# Patient Record
Sex: Male | Born: 1995 | Hispanic: Yes | Marital: Single | Smoking: Never smoker
Health system: Southern US, Community
[De-identification: ages and names within clinical notes are randomized; demographics above are authoritative.]

---

## 2018-06-20 ENCOUNTER — Other Ambulatory Visit: Payer: Self-pay

## 2018-06-20 ENCOUNTER — Emergency Department
Admission: EM | Admit: 2018-06-20 | Discharge: 2018-06-20 | Disposition: A | Discharge status: Home / Self Care | Payer: Self-pay | Attending: Emergency Medicine | Admitting: Emergency Medicine

## 2018-06-20 ENCOUNTER — Emergency Department: Payer: Worker's Compensation

## 2018-06-20 DIAGNOSIS — Y9389 Activity, other specified: Secondary | ICD-10-CM | POA: Insufficient documentation

## 2018-06-20 DIAGNOSIS — S46911A Strain of unspecified muscle, fascia and tendon at shoulder and upper arm level, right arm, initial encounter: Secondary | ICD-10-CM | POA: Insufficient documentation

## 2018-06-20 DIAGNOSIS — Y9289 Other specified places as the place of occurrence of the external cause: Secondary | ICD-10-CM | POA: Insufficient documentation

## 2018-06-20 DIAGNOSIS — Y998 Other external cause status: Secondary | ICD-10-CM | POA: Insufficient documentation

## 2018-06-20 DIAGNOSIS — W228XXA Striking against or struck by other objects, initial encounter: Secondary | ICD-10-CM | POA: Insufficient documentation

## 2018-06-20 MED ORDER — MELOXICAM 15 MG PO TABS: 15.0000 mg | ORAL_TABLET | Freq: Every day | ORAL | 2 refills | Status: AC

## 2018-06-20 NOTE — ED Notes (Signed)
See triage note  States he lifted something heavy 2 days ago  Developed pain to right shoulder   No deformity  Having increased pain with movement  Good pulses

## 2018-06-20 NOTE — ED Triage Notes (Signed)
Pt states he injured his right shoulder while at work on Wednesday while lifting a plastic roll.

## 2018-06-20 NOTE — ED Provider Notes (Signed)
[facility] Emergency Department Provider Note  ____________________________________________   First MD Initiated Contact with Patient 06/20/18 1137     (approximate)  I have reviewed the triage vital signs and the nursing notes.   HISTORY  Chief Complaint Shoulder Injury    HPI Spencer Wheeler is a 23 y.o. male presents emergency department complaining of right shoulder pain.  Patient states he was lifting a heavy roll of plastic and when it landed on his right shoulder he felt a pop.  He states he has popping and clicking when he elevates the shoulder and the pain is located towards the back of the scapula.  He denies any numbness or tingling.    History reviewed. No pertinent past medical history.  There are no active problems to display for this patient.   History reviewed. No pertinent surgical history.  Prior to Admission medications   Medication Sig Start Date End Date Taking? Authorizing Provider  meloxicam (MOBIC) 15 MG tablet Take 1 tablet (15 mg total) by mouth daily. 06/20/18 06/20/19  Faythe GheeFisher, Susan W, PA-C    Allergies Patient has no known allergies.  No family history on file.  Social History Social History   Tobacco Use  . Smoking status: Never Smoker  . Smokeless tobacco: Never Used  Substance Use Topics  . Alcohol use: Not Currently  . Drug use: Not Currently    Review of Systems  Constitutional: No fever/chills Eyes: No visual changes. ENT: No sore throat. Respiratory: Denies cough Genitourinary: Negative for dysuria. Musculoskeletal: Negative for back pain.  Positive for right shoulder pain Skin: Negative for rash.    ____________________________________________   PHYSICAL EXAM:  VITAL SIGNS: ED Triage Vitals  Enc Vitals Group     BP 06/20/18 1122 128/78     Pulse Rate 06/20/18 1123 88     Resp 06/20/18 1122 18     Temp 06/20/18 1122 98 F (36.7 C)     Temp Source 06/20/18 1122 Oral     SpO2 06/20/18  1122 99 %     Weight 06/20/18 1114 140 lb (63.5 kg)     Height 06/20/18 1114 5\' 8"  (1.727 m)     Head Circumference --      Peak Flow --      Pain Score 06/20/18 1114 8     Pain Loc --      Pain Edu? --      Excl. in GC? --     Constitutional: Alert and oriented. Well appearing and in no acute distress. Eyes: Conjunctivae are normal.  Head: Atraumatic. Nose: No congestion/rhinnorhea. Mouth/Throat: Mucous membranes are moist.   Neck:  supple no lymphadenopathy noted Cardiovascular: Normal rate, regular rhythm. Respiratory: Normal respiratory effort.  No retractions, GU: deferred Musculoskeletal: FROM all extremities, warm and well perfused, the right upper back is tender at the scapular area, scapula bursa is tender, neurovascular is intact Neurologic:  Normal speech and language.  Skin:  Skin is warm, dry and intact. No rash noted. Psychiatric: Mood and affect are normal. Speech and behavior are normal.  ____________________________________________   LABS (all labs ordered are listed, but only abnormal results are displayed)  Labs Reviewed - No data to display ____________________________________________   ____________________________________________  RADIOLOGY  X-ray of the right shoulder is negative for any acute abnormality  ____________________________________________   PROCEDURES  Procedure(s) performed: Sling applied by nursing staff   Procedures    ____________________________________________   INITIAL IMPRESSION / ASSESSMENT AND PLAN / ED  COURSE  Pertinent labs & imaging results that were available during my care of the patient were reviewed by me and considered in my medical decision making (see chart for details).   Patient is 23 year old male presents emergency department with workers comp injury of the right shoulder.  Physical exam shows the right shoulder to be tender along with some tenderness underneath the scapula.  X-ray of the right  shoulder is negative for any acute abnormality  Patient was given a sling.  Prescription for meloxicam.  He is to follow-up with orthopedics of Worker's Comp. choice and he was given the on-call physician's phone number also.  He states he understands and will comply.  Light duty instructions were given for 1 week.  And a UDS was obtained here in the ED.      As part of my medical decision making, I reviewed the following data within the electronic MEDICAL RECORD NUMBER Nursing notes reviewed and incorporated, Interpreter needed, Old chart reviewed, Radiograph reviewed x-ray of the right shoulder is negative, Notes from prior ED visits and [place] Controlled Substance Database  ____________________________________________   FINAL CLINICAL IMPRESSION(S) / ED DIAGNOSES  Final diagnoses:  Shoulder strain, right, initial encounter      NEW MEDICATIONS STARTED DURING THIS VISIT:  Discharge Medication List as of 06/20/2018 12:12 PM    START taking these medications   Details  meloxicam (MOBIC) 15 MG tablet Take 1 tablet (15 mg total) by mouth daily., Starting Fri 06/20/2018, Until Sat 06/20/2019, Print         Note:  This document was prepared using Dragon voice recognition software and may include unintentional dictation errors.    Faythe Ghee, PA-C 06/20/18 1533    Jene Every, MD 06/21/18 1535

## 2019-12-01 IMAGING — CR RIGHT SHOULDER - 2+ VIEW
1 series · 3 of 3 positions shown · non-contrast
Comparison: None.

CLINICAL DATA: Right shoulder pain after lifting a heavy object 2
days ago.

EXAM:
RIGHT SHOULDER - 2+ VIEW

[Series 1: dg shoulder right · 0.14mm/px · 3 of 3 slices shown]
[im 1/3]
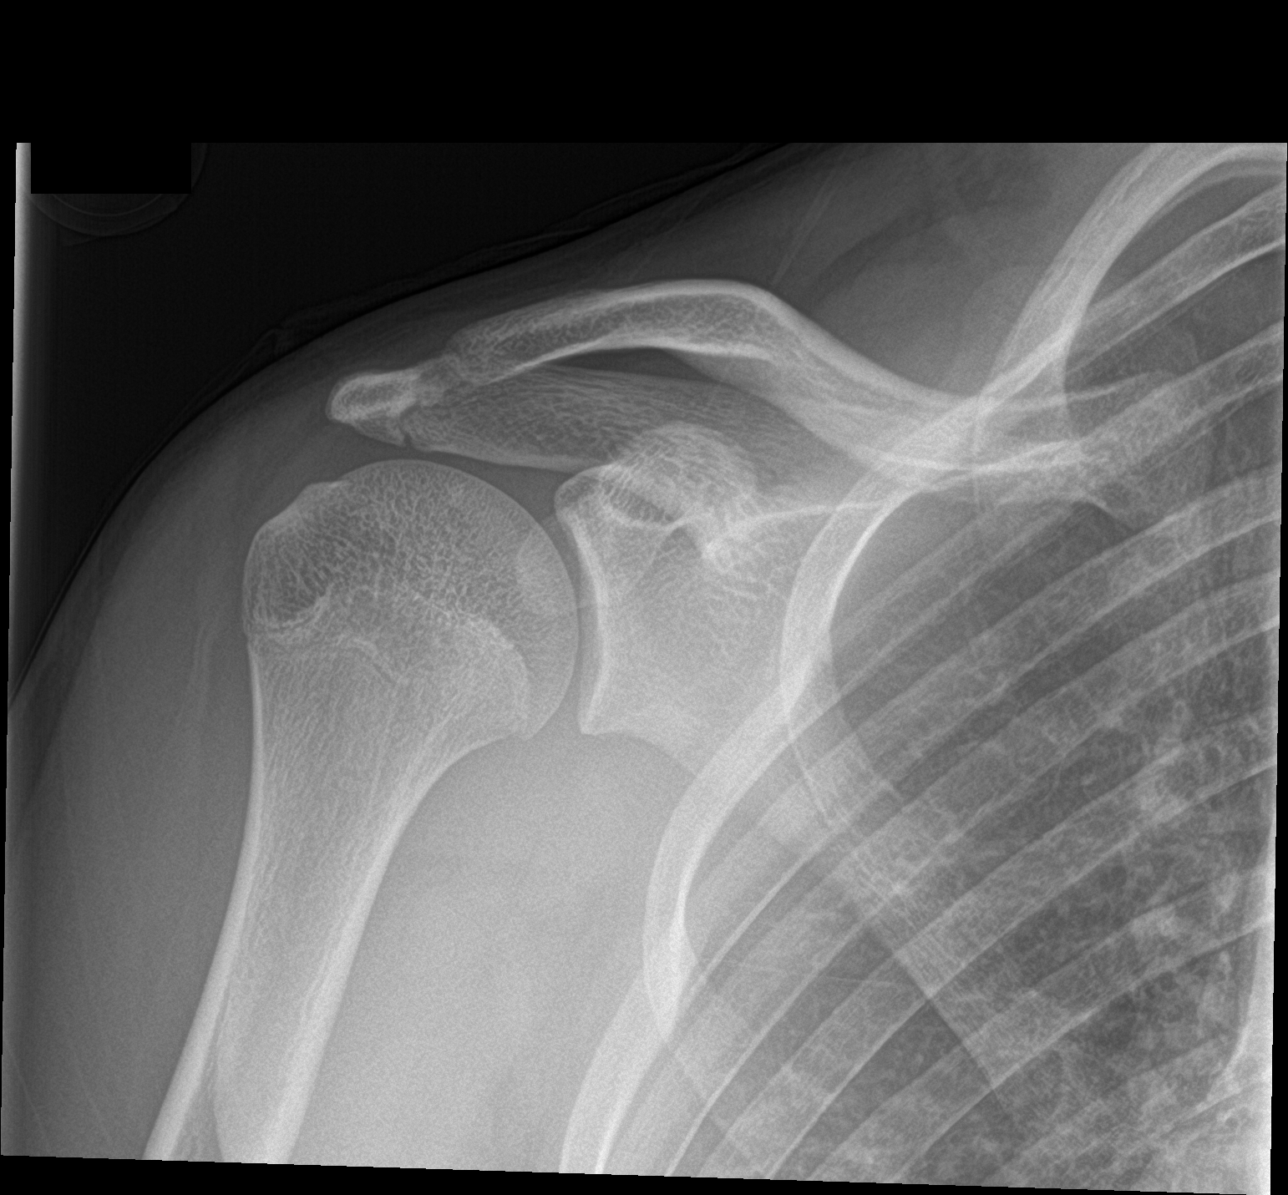
[im 2/3]
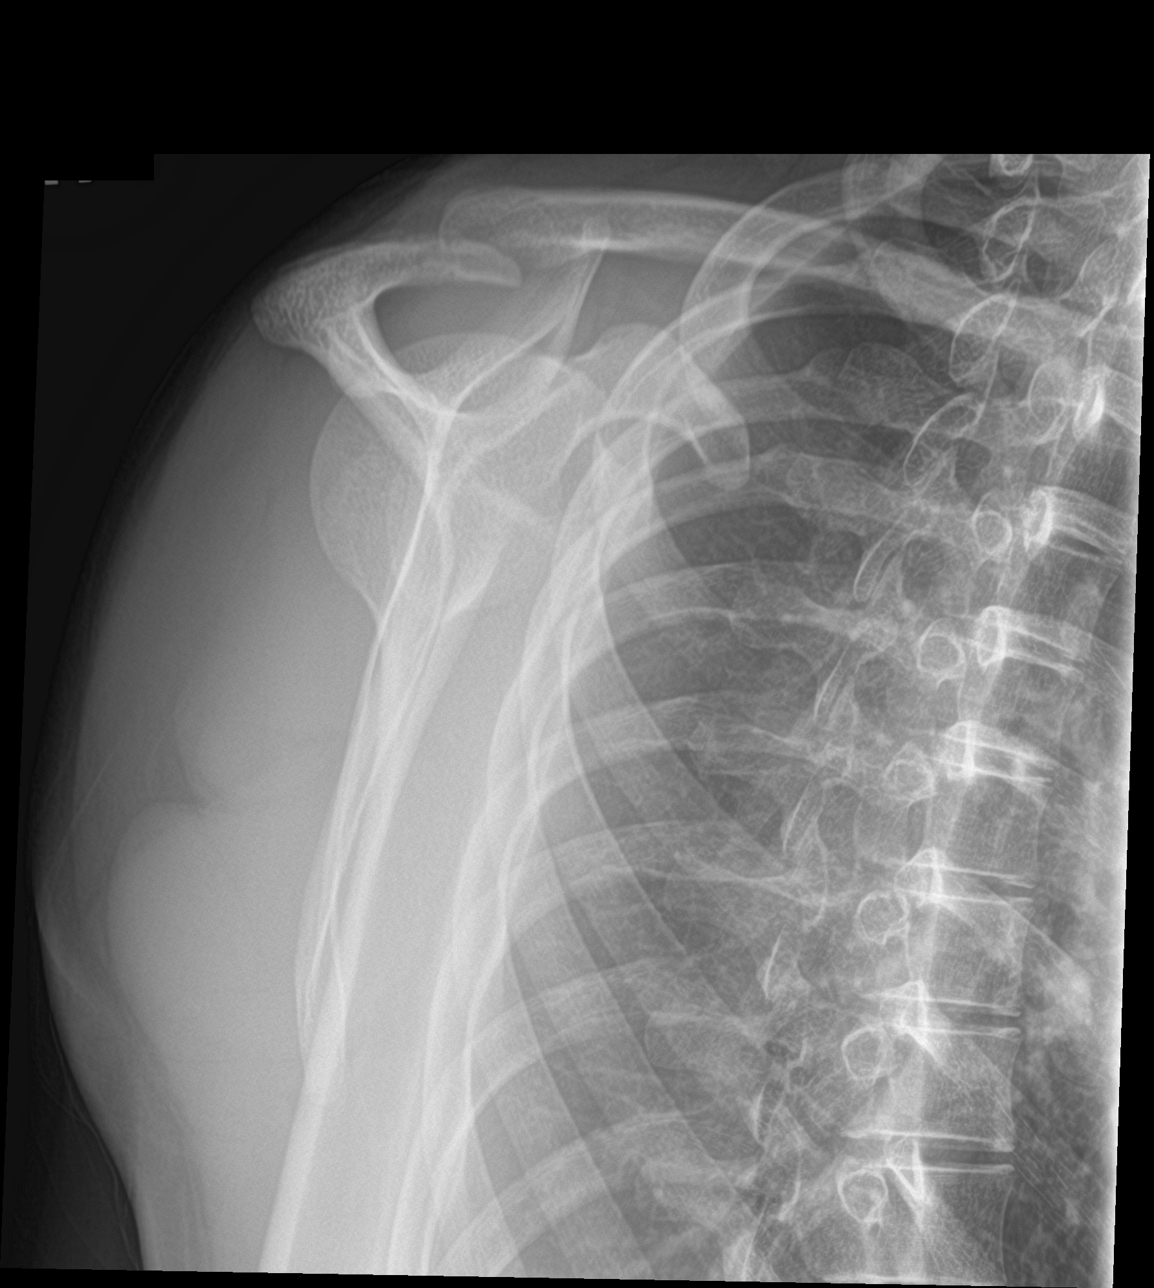
[im 3/3]
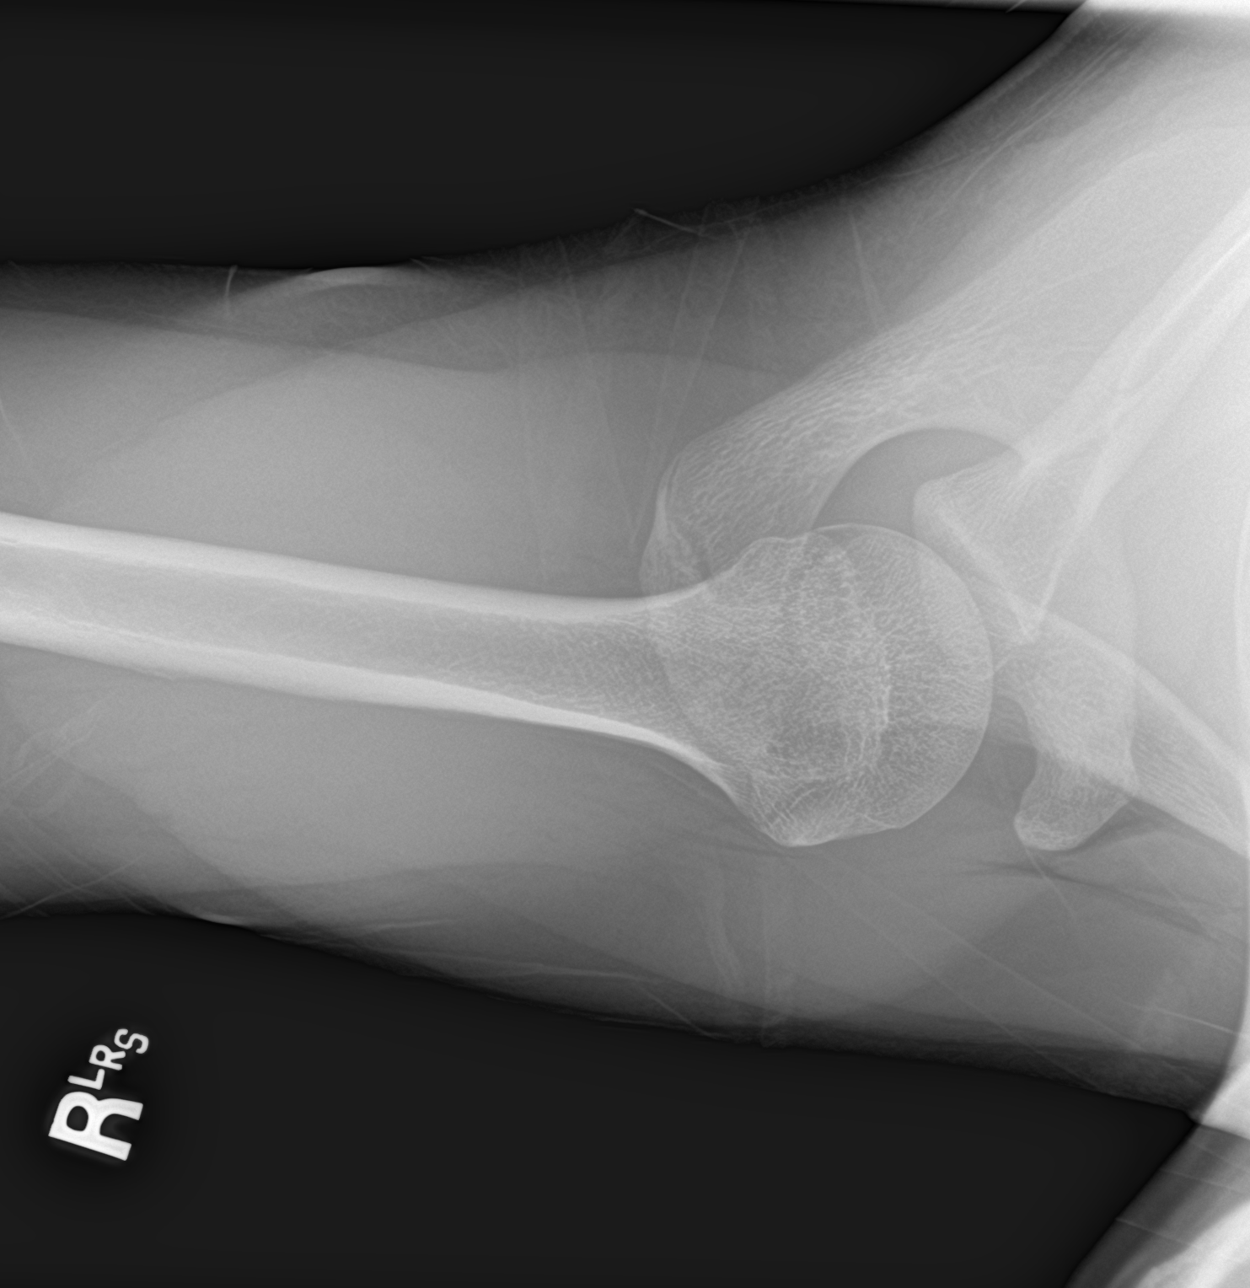

[3 of 3 positions shown; findings below may reference images not displayed]

FINDINGS: No acute fracture or dislocation is identified. No significant
arthropathic changes are seen. The acromial ossification center has
not yet fused. The soft tissues are unremarkable.
IMPRESSION: Negative.
